# Patient Record
Sex: Female | Born: 1975 | Race: White | Hispanic: No | Smoking: Never smoker
Health system: Southern US, Community
[De-identification: ages and names within clinical notes are randomized; demographics above are authoritative.]

## PROBLEM LIST (undated history)

## (undated) DIAGNOSIS — F419 Anxiety disorder, unspecified: Secondary | ICD-10-CM

## (undated) HISTORY — DX: Anxiety disorder, unspecified: F41.9

---

## 1998-06-25 ENCOUNTER — Encounter: Admission: RE | Admit: 1998-06-25 | Discharge: 1998-06-25 | Payer: Self-pay | Admitting: *Deleted

## 2000-09-15 ENCOUNTER — Emergency Department (HOSPITAL_COMMUNITY): Admission: EM | Admit: 2000-09-15 | Discharge: 2000-09-15 | Payer: Self-pay | Admitting: Emergency Medicine

## 2004-06-27 ENCOUNTER — Other Ambulatory Visit: Admission: RE | Admit: 2004-06-27 | Discharge: 2004-06-27 | Payer: Self-pay | Admitting: *Deleted

## 2008-08-29 ENCOUNTER — Ambulatory Visit (HOSPITAL_COMMUNITY): Admission: RE | Admit: 2008-08-29 | Discharge: 2008-08-29 | Payer: Self-pay | Admitting: Obstetrics and Gynecology

## 2010-08-06 ENCOUNTER — Ambulatory Visit: Payer: Self-pay | Admitting: Physician Assistant

## 2010-08-06 ENCOUNTER — Inpatient Hospital Stay (HOSPITAL_COMMUNITY): Admission: AD | Admit: 2010-08-06 | Discharge: 2010-08-06 | Payer: Self-pay | Admitting: Obstetrics and Gynecology

## 2010-11-09 ENCOUNTER — Inpatient Hospital Stay (HOSPITAL_COMMUNITY): Admission: AD | Admit: 2010-11-09 | Payer: Self-pay | Admitting: Obstetrics and Gynecology

## 2010-11-12 ENCOUNTER — Inpatient Hospital Stay (HOSPITAL_COMMUNITY)
Admission: RE | Admit: 2010-11-12 | Discharge: 2010-11-16 | DRG: 371 | Disposition: A | Discharge status: Home / Self Care | Payer: BC Managed Care – PPO | Source: Ambulatory Visit | Attending: Obstetrics and Gynecology | Admitting: Obstetrics and Gynecology

## 2010-11-12 DIAGNOSIS — O480 Post-term pregnancy: Principal | ICD-10-CM | POA: Diagnosis present

## 2010-11-12 LAB — CBC
HCT: 39.4 % (ref 36.0–46.0)
Hemoglobin: 13.4 g/dL (ref 12.0–15.0)
MCH: 31.7 pg (ref 26.0–34.0)
MCHC: 34 g/dL (ref 30.0–36.0)
MCV: 93.1 fL (ref 78.0–100.0)
Platelets: 143 10*3/uL — ABNORMAL LOW (ref 150–400)
RBC: 4.23 MIL/uL (ref 3.87–5.11)
RDW: 13.4 % (ref 11.5–15.5)
WBC: 8.3 10*3/uL (ref 4.0–10.5)

## 2010-11-14 LAB — CBC
HCT: 32.1 % — ABNORMAL LOW (ref 36.0–46.0)
Hemoglobin: 10.8 g/dL — ABNORMAL LOW (ref 12.0–15.0)
MCH: 31.6 pg (ref 26.0–34.0)
MCV: 93.9 fL (ref 78.0–100.0)
RBC: 3.42 MIL/uL — ABNORMAL LOW (ref 3.87–5.11)
WBC: 11.5 10*3/uL — ABNORMAL HIGH (ref 4.0–10.5)

## 2010-11-27 NOTE — Op Note (Signed)
  NAMEJAIYA, [name]                 ACCOUNT NO.:  [id number]  MEDICAL RECORD NO.:  [id number]           PATIENT TYPE:  I  LOCATION:  9117                          FACILITY:  [facility]  PHYSICIAN:  Anabell Swint L. Dionis Autry, M.D.DATE OF BIRTH:  Sep 16, 1976  DATE OF PROCEDURE:  11/13/2010 DATE OF DISCHARGE:                              OPERATIVE REPORT   PREOPERATIVE DIAGNOSIS:  Intrauterine pregnancy at 40 plus weeks and arrest of dilation.  POSTOPERATIVE DIAGNOSES:  Intrauterine pregnancy at 40 plus weeks and arrest of dilation.  PROCEDURE:  Primary low transverse cesarean section.  SURGEON:  Laquilla Dault L. Alfons Sulkowski, MD  ANESTHESIA:  Epidural.  FINDINGS:  Female infant, cephalic presentation, OP position, Apgars 9 at 1 minute and 9 at 7 minutes.  PATHOLOGY:  None.  ESTIMATED BLOOD LOSS:  500 mL.  Foley is drained.  COMPLICATIONS:  None.  PROCEDURE IN DETAIL:  The patient was taken to the operating room.  Her epidural was dosed and found to be adequate.  She was prepped and draped in usual sterile fashion.  A low transverse incision was made in the skin and carried down to fascia.  Fascia was scored in midline, extended laterally.  Rectus muscles were separated in midline.  The peritoneum was entered bluntly.  The peritoneal incision was then stretched.  The bladder blade was inserted.  The lower uterine segment was identified sharply and then digitally, the bladder blade was then readjusted.  A low transverse incision was made in the uterus.  Uterus was entered using a hemostat.  The baby was OP position, was delivered without any difficulty with a female infant Apgars 9 at 1 minute and 9 at 5 minutes. The cord was clamped and cut.  The baby was handed to the awaiting neonatal team and taken to the nursery.  The placenta was manually removed, noted be normal intact with a three-vessel cord.  The uterus was exteriorized and cleared of all clots and debris.  The placenta was normal.  The  uterine incision was closed in one layer using 0 chromic in a running locked stitch.  The uterus was returned to the abdomen. Irrigation was performed.  The peritoneum was closed using 0 Vicryl.  The fascia was closed using 0 Vicryl in a running stitch.  After irrigation of subcutaneous layer, the skin was closed with staples.  All sponge, lap, and instrument counts were correct x2.  The patient went to recovery room in stable condition.     Chana Lindstrom L. Vincente Poli, M.D.     Florestine Avers  D:  11/13/2010  T:  11/14/2010  Job:  [id number]  Electronically Signed by Marcelle Overlie M.D. on 11/27/2010 07:56:16 AM

## 2010-12-17 LAB — WET PREP, GENITAL
Clue Cells Wet Prep HPF POC: NONE SEEN
Trich, Wet Prep: NONE SEEN

## 2010-12-17 LAB — URINALYSIS, ROUTINE W REFLEX MICROSCOPIC
Bilirubin Urine: NEGATIVE
Glucose, UA: NEGATIVE mg/dL
Hgb urine dipstick: NEGATIVE
Nitrite: NEGATIVE
Specific Gravity, Urine: <1.005 — ABNORMAL LOW (ref 1.005–1.030)
pH: 6 (ref 5.0–8.0)

## 2010-12-23 NOTE — Discharge Summary (Signed)
NAMEKAYLOR, [name]                 ACCOUNT NO.:  [id number]  MEDICAL RECORD NO.:  [id number]           PATIENT TYPE:  I  LOCATION:  9117                          [facility]ILITY:  [facility]  PHYSICIAN:  Dineen Kid. Rana Snare, M.D.    DATE OF BIRTH:  August 12, 1976  DATE OF ADMISSION:  11/12/2010 DATE OF DISCHARGE:  11/16/2010                              DISCHARGE SUMMARY   ADMITTING DIAGNOSES: 1. Intrauterine pregnancy at 40-1/7 weeks' estimated gestational age. 2. Postdate induction of labor.  DISCHARGE DIAGNOSES: 1. Status post low transverse cesarean section secondary to arrest of     dilatation. 2. Viable female infant.  PROCEDURE:  Primary low transverse cesarean section.  REASON FOR ADMISSION:  Please see written H and P.  HOSPITAL COURSE:  The patient is a 35 year old primigravida who was admitted to [facility] at 40-3/7 weeks estimated gestational age for postdate induction of labor.  On admission, vital signs were stable.  Fetal heart tones were reassuring.  Contractions were approximately zero to for mild the patient had been given Cytotec on the evening prior to admission.  Cervix was examined and found to be 1+ centimeters 80% effaced, vertex at -2 station.  Artificial rupture of membranes was performed which revealed clear fluid.  The patient was started on some Pitocin augmentation of labor and epidural was placed for her comfort.  Later in the afternoon, the patient was comfortable with the epidural.  Intrauterine pressure catheter revealed she was having adequate labor.  Cervix was examined and found to be high, 2 cm, 80% effaced with caput noted.  Cervix seemed to be somewhat puffy and thought to be scarring, decision was made to proceed with primary low transverse cesarean section.  The patient was then transferred to the operating room where epidural was dosed to an adequate surgical level. A low transverse incision was made with delivery of a viable  female infant weighing 7 pounds 10 ounces with Apgars of 9 at 1 minute and 9 at 5 minutes.  The patient tolerated the procedure well and was taken to the recovery room in stable condition.  On postoperative day #1, the patient was without complaint.  Vital signs were stable.  Abdomen soft. Fundus firm and nontender.  Abdominal dressings noted to have a small amount of drainage noted on the bandage.  Foley had been discontinued with adequate output.  Laboratory findings showed hemoglobin of 10.8, platelet count 127,000, blood type is known to be O+.  On postoperative day #2, the patient was without complaint.  Vital signs remained stable. Abdomen, soft.  Fundus firm and nontender.  Abdominal dressing continued to have some old drainage noted on the bandage.  She was ambulating well.  On postoperative day #3, the patient was without complaint. Vital signs were stable.  Fundus firm and nontender.  Incision was clean, dry and intact.  Discharge instructions reviewed and the patient was later discharged home.  CONDITION ON DISCHARGE:  Stable.  DIET:  Regular as tolerated.  ACTIVITY:  No heavy lifting, no driving x2 weeks, no vaginal entry.  FOLLOWUP:  Patient to follow up in the  office in 1-2 weeks for incision check.  She is to call for temperature greater than 100 degrees, persistent nausea, vomiting, heavy vaginal bleeding and/or redness or drainage from the incisional site.  DISCHARGE MEDICATIONS:  Tylox #30 one p.o. q.4-6 hours p.r.n.  Motrin 600 mg every 6 hours, prenatal vitamins one p.o. daily, Colace one p.o. daily p.r.n.     Julio Sicks, N.P.   ______________________________ Dineen Kid Rana Snare, M.D.    CC/MEDQ  D:  12/12/2010  T:  12/12/2010  Job:  [id number]  Electronically Signed by Julio Sicks N.P. on 12/13/2010 08:32:12 AM Electronically Signed by Candice Camp M.D. on 12/23/2010 07:26:07 AM

## 2011-01-28 ENCOUNTER — Ambulatory Visit (INDEPENDENT_AMBULATORY_CARE_PROVIDER_SITE_OTHER): Payer: BC Managed Care – PPO | Admitting: Gastroenterology

## 2011-01-28 ENCOUNTER — Encounter: Payer: Self-pay | Admitting: Gastroenterology

## 2011-01-28 VITALS — BP 90/58 | HR 80 | Ht 65.0 in | Wt 142.2 lb

## 2011-01-28 DIAGNOSIS — K5900 Constipation, unspecified: Secondary | ICD-10-CM

## 2011-01-28 DIAGNOSIS — K921 Melena: Secondary | ICD-10-CM

## 2011-01-28 MED ORDER — PEG-KCL-NACL-NASULF-NA ASC-C 100 G PO SOLR: 1.0000 | Freq: Once | ORAL | Status: AC

## 2011-01-28 NOTE — Patient Instructions (Addendum)
You have been scheduled for a Colonoscopy and separate instruction sheet given.  High Fiber diet handout given. Pick up your prep from your pharmacy.  Cc: Harold Hedge, MD

## 2011-01-28 NOTE — Progress Notes (Signed)
History of Present Illness: This is a 35 year old female here today with her husband. She relates a long history of mild constipation with a bowel movement generally 2-3 times per week. She is now approximately 11 weeks postpartum and has had worsening problems with constipation and recent episode of severe constipation with rectal pain and straining to pass a bowel movement for over 6 hours about one week ago and with this bowel movement she had bright red blood per rectum. She denies abdominal pain, change in stool caliber, weight loss, melena, nausea, vomiting, reflux symptoms. Her sister has inflammatory bowel disease and her mother has colon polyps. A grandparent has colon cancer.   History reviewed. No pertinent past medical history. Past Surgical History  Procedure Date  . Cesarean section     reports that she has never smoked. She has never used smokeless tobacco. She reports that she does not drink alcohol or use illicit drugs. family history includes Colon cancer in her maternal grandfather; Colon polyps in her maternal grandfather; and Ulcerative colitis in her sister. No Known Allergies  Outpatient Encounter Prescriptions as of 01/28/2011  Medication Sig Dispense Refill  . Docusate Sodium (COLACE PO) Take by mouth. As needed       . Multiple Vitamins-Calcium (ONE-A-DAY WOMENS FORMULA PO) Take by mouth 1 dose over 46 hours.        Lorita Officer Triphasic (ORTHO TRI-CYCLEN, 28, PO) Take by mouth 1 dose over 46 hours.        . peg 3350 powder (MOVIPREP) 100 G SOLR Take 1 kit (100 g total) by mouth once.  1 kit  0   Review of Systems: Pertinent positive and negative review of systems were noted in the above HPI section. All other review of systems were otherwise negative.  Physical Exam: General: Well developed , well nourished, no acute distress Head: Normocephalic and atraumatic Eyes:  sclerae anicteric, EOMI Ears: Normal auditory acuity Mouth: No deformity or  lesions Neck: Supple, no masses or thyromegaly Lungs: Clear throughout to auscultation Heart: Regular rate and rhythm; no murmurs, rubs or bruits Abdomen: Soft, non tender and non distended. No masses, hepatosplenomegaly or hernias noted. Normal Bowel sounds Rectal: Deferred to colonoscopy  Musculoskeletal: Symmetrical with no gross deformities  Skin: No lesions on visible extremities Pulses:  Normal pulses noted Extremities: No clubbing, cyanosis, edema or deformities noted Neurological: Alert oriented x 4, grossly nonfocal Cervical Nodes:  No significant cervical adenopathy Inguinal Nodes: No significant inguinal adenopathy Psychological:  Alert and cooperative. Normal mood and affect  Assessment and Recommendations:  1. Worsening constipation and one episode of hematochezia. I suspect her bleeding was related to hemorrhoids or anal/rectal irritation related to the hard stool. Rule out inflammatory bowel disease, colorectal neoplasms and other less likely abnormalities. She is advised to begin a high fiber diet and to substantially increase her daily water intake. She may use a fiber supplement and MiraLax if the dietary changes are not adequate. The risks, benefits, and alternatives to colonoscopy with possible biopsy and possible polypectomy were discussed with the patient and they consent to proceed.

## 2011-01-29 ENCOUNTER — Ambulatory Visit (AMBULATORY_SURGERY_CENTER): Payer: BC Managed Care – PPO | Admitting: Gastroenterology

## 2011-01-29 ENCOUNTER — Encounter: Payer: Self-pay | Admitting: Gastroenterology

## 2011-01-29 DIAGNOSIS — K5900 Constipation, unspecified: Secondary | ICD-10-CM

## 2011-01-29 DIAGNOSIS — K921 Melena: Secondary | ICD-10-CM

## 2011-01-29 DIAGNOSIS — R198 Other specified symptoms and signs involving the digestive system and abdomen: Secondary | ICD-10-CM

## 2011-01-29 MED ORDER — SODIUM CHLORIDE 0.9 % IV SOLN: 500.0000 mL | INTRAVENOUS | Status: AC

## 2011-01-29 NOTE — Patient Instructions (Addendum)
Discharged instructions given with verbal understanding. Normal examination. Resume previous medications. High fiber diet given.

## 2011-01-30 ENCOUNTER — Telehealth: Payer: Self-pay

## 2011-01-30 NOTE — Telephone Encounter (Signed)
Left message

## 2011-07-08 LAB — ABO/RH: ABO/RH(D): O POS

## 2013-12-03 ENCOUNTER — Ambulatory Visit: Payer: Self-pay

## 2013-12-03 LAB — RAPID INFLUENZA A&B ANTIGENS

## 2014-03-07 ENCOUNTER — Ambulatory Visit: Payer: BC Managed Care – PPO | Admitting: Internal Medicine

## 2014-03-29 ENCOUNTER — Ambulatory Visit: Payer: BC Managed Care – PPO | Admitting: Internal Medicine

## 2015-03-24 ENCOUNTER — Ambulatory Visit
Admission: EM | Admit: 2015-03-24 | Discharge: 2015-03-24 | Disposition: A | Discharge status: Home / Self Care | Payer: BC Managed Care – PPO | Attending: Family Medicine | Admitting: Family Medicine

## 2015-03-24 ENCOUNTER — Encounter: Payer: Self-pay | Admitting: Gynecology

## 2015-03-24 DIAGNOSIS — Z79899 Other long term (current) drug therapy: Secondary | ICD-10-CM | POA: Diagnosis not present

## 2015-03-24 DIAGNOSIS — J029 Acute pharyngitis, unspecified: Secondary | ICD-10-CM | POA: Diagnosis present

## 2015-03-24 DIAGNOSIS — J020 Streptococcal pharyngitis: Secondary | ICD-10-CM | POA: Diagnosis not present

## 2015-03-24 LAB — RAPID STREP SCREEN (MED CTR MEBANE ONLY): Streptococcus, Group A Screen (Direct): POSITIVE — AB

## 2015-03-24 MED ORDER — IBUPROFEN 800 MG PO TABS
800.0000 mg | ORAL_TABLET | Freq: Once | ORAL | Status: AC
  Administered 2015-03-24: 800 mg via ORAL

## 2015-03-24 MED ORDER — PENICILLIN G BENZATHINE 1200000 UNIT/2ML IM SUSP
1.2000 10*6.[IU] | Freq: Once | INTRAMUSCULAR | Status: AC
  Administered 2015-03-24: 1.2 10*6.[IU] via INTRAMUSCULAR

## 2015-03-24 NOTE — Discharge Instructions (Signed)
Good handwashing and mask precautions-during care of  your husband... Ibuprofen/Tylenol alternating - fluids-rest and as much self care as possible! Hope you are feeling better very soon!  Strep Throat Strep throat is an infection of the throat caused by a bacteria named Streptococcus pyogenes. Your health care provider may call the infection streptococcal "tonsillitis" or "pharyngitis" depending on whether there are signs of inflammation in the tonsils or back of the throat. Strep throat is most common in children aged 5-15 years during the cold months of the year, but it can occur in people of any age during any season. This infection is spread from person to person (contagious) through coughing, sneezing, or other close contact. SIGNS AND SYMPTOMS   Fever or chills.  Painful, swollen, red tonsils or throat.  Pain or difficulty when swallowing.  White or yellow spots on the tonsils or throat.  Swollen, tender lymph nodes or "glands" of the neck or under the jaw.  Red rash all over the body (rare). DIAGNOSIS  Many different infections can cause the same symptoms. A test must be done to confirm the diagnosis so the right treatment can be given. A "rapid strep test" can help your health care provider make the diagnosis in a few minutes. If this test is not available, a light swab of the infected area can be used for a throat culture test. If a throat culture test is done, results are usually available in a day or two. TREATMENT  Strep throat is treated with antibiotic medicine. HOME CARE INSTRUCTIONS   Gargle with 1 tsp of salt in 1 cup of warm water, 3-4 times per day or as needed for comfort.  Family members who also have a sore throat or fever should be tested for strep throat and treated with antibiotics if they have the strep infection.  Make sure everyone in your household washes their hands well.  Do not share food, drinking cups, or personal items that could cause the infection  to spread to others.  You may need to eat a soft food diet until your sore throat gets better.  Drink enough water and fluids to keep your urine clear or pale yellow. This will help prevent dehydration.  Get plenty of rest.  Stay home from school, day care, or work until you have been on antibiotics for 24 hours.  Take medicines only as directed by your health care provider.  Take your antibiotic medicine as directed by your health care provider. Finish it even if you start to feel better. SEEK MEDICAL CARE IF:   The glands in your neck continue to enlarge.  You develop a rash, cough, or earache.  You cough up green, yellow-brown, or bloody sputum.  You have pain or discomfort not controlled by medicines.  Your problems seem to be getting worse rather than better.  You have a fever. SEEK IMMEDIATE MEDICAL CARE IF:   You develop any new symptoms such as vomiting, severe headache, stiff or painful neck, chest pain, shortness of breath, or trouble swallowing.  You develop severe throat pain, drooling, or changes in your voice.  You develop swelling of the neck, or the skin on the neck becomes red and tender.  You develop signs of dehydration, such as fatigue, dry mouth, and decreased urination.  You become increasingly sleepy, or you cannot wake up completely. MAKE SURE YOU:  Understand these instructions.  Will watch your condition.  Will get help right away if you are not doing well or  get worse. Document Released: 09/19/2000 Document Revised: 02/06/2014 Document Reviewed: 11/21/2010 [facility] Patient Information 2015 [place], [place]. This information is not intended to replace advice given to you by your health care provider. Make sure you discuss any questions you have with your health care provider.

## 2015-03-24 NOTE — ED Provider Notes (Signed)
CSN: [id number]     Arrival date & time 03/24/15  1020 History   First MD Initiated Contact with Patient 03/24/15 1115     Chief Complaint  Patient presents with  . 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 Mild temp elevation -taking tylenol- no Rx today-general malaise  History reviewed. No pertinent past medical history. Past Surgical History  Procedure Laterality Date  . Cesarean section     Family History  Problem Relation Age of Onset  . Colon cancer Maternal Grandfather   . Colon polyps Maternal Grandfather     MOTHER  . Ulcerative colitis Sister   . Lymphoma Father    History  Substance Use Topics  . Smoking status: Never Smoker   . Smokeless tobacco: Never Used  . Alcohol Use: No   OB History    No data available     Review of Systems Review of 10 systems negative for acute change except as referenced in HPI Allergies  Review of patient's allergies indicates no known allergies.  Home Medications   Prior to Admission medications   Medication Sig Start Date End Date Taking? Authorizing Provider  Docusate Sodium (COLACE PO) Take by mouth. As needed     Historical Provider, MD  Multiple Vitamins-Calcium (ONE-A-DAY WOMENS FORMULA PO) Take by mouth 1 dose over 46 hours.      Historical Provider, MD  Norgestim-Eth Estrad Triphasic (ORTHO TRI-CYCLEN, 28, PO) Take by mouth 1 dose over 46 hours.      Historical Provider, MD   BP 114/68 mmHg  Pulse 94  Temp(Src) 99.1 F (37.3 C) (Tympanic)  Ht 5\' 6"  (1.676 m)  Wt 158 lb (71.668 kg)  BMI 25.51 kg/m2  SpO2 100%  LMP 02/13/2015 Physical Exam Constitutional -alert and oriented,tired appearing Head-atraumatic Eyes- conjunctiva normal, EOMI ,conjugate gaze Nose-  congestion  Mouth/throat- mucous membranes moist ,oropharynx beefy red;  tonsil with exudate; strep- positive Neck- supple , bilateral ant  glandular enlargement, mild tender,rubbery CV- regular rate, grossly normal heart sounds,  Resp-no distress, normal respiratory effort,clear to auscultation bilaterally GI- soft,non-tender,no distention GU-  not examined MSK- no lower extremity tenderness nor edema, ambulatory Neuro- normal speech and language, Skin-warm,dry ,intact; no rash noted Psych-mood and affect grossly normal; speech and behavior grossly normal ED Course  Procedures (including critical care time) Labs Review Labs Reviewed  RAPID STREP SCREEN (NOT AT [facility]) - Abnormal; Notable for the following:    Streptococcus, Group A Screen (Direct) POSITIVE (*)    All other components within normal limits   Results for orders placed or performed during the hospital encounter of 03/24/15  Rapid strep screen  Result Value Ref Range   Streptococcus, Group A Screen (Direct) POSITIVE (A) NEGATIVE     Medications  penicillin g benzathine (BICILLIN LA) 1200000 UNIT/2ML injection 1.2 Million Units (1.2 Million Units Intramuscular Given 03/24/15 1135)  ibuprofen (ADVIL,MOTRIN) tablet 800 mg (800 mg Oral Given 03/24/15 1138)   Patient tolerated Rx without difficulty. Feels better after Ibuprofen took away discomfort  Imaging Review No results found.   She opts for IM Rx as husband has cancer and she wants germs under control in the houshold ASAP.  MDM   1. Strep pharyngitis     Plan: 1. Test results and diagnosis reviewed with patient 2. Rx as per orders; risks, benefits, potential side effects reviewed with patient 3. Recommend  supportive treatment with ibuprofen,tylenol, soft foods, advance as tolerted 4. F/u prn if symptoms worsen or don't improve   Rae Halsted, PA-C 03/26/15 0018

## 2015-03-24 NOTE — ED Notes (Signed)
Pt. C/o sore throat and body aches. Per pt. Painful to swallow x yesterday.Marland Kitchen

## 2015-03-26 ENCOUNTER — Encounter: Payer: Self-pay | Admitting: Physician Assistant

## 2015-09-04 ENCOUNTER — Other Ambulatory Visit: Payer: Self-pay | Admitting: Otolaryngology

## 2015-09-04 DIAGNOSIS — E041 Nontoxic single thyroid nodule: Secondary | ICD-10-CM

## 2015-09-10 ENCOUNTER — Ambulatory Visit
Admission: RE | Admit: 2015-09-10 | Discharge: 2015-09-10 | Disposition: A | Discharge status: Home / Self Care | Payer: BC Managed Care – PPO | Source: Ambulatory Visit | Attending: Otolaryngology | Admitting: Otolaryngology

## 2015-09-10 DIAGNOSIS — E041 Nontoxic single thyroid nodule: Secondary | ICD-10-CM | POA: Diagnosis not present

## 2015-09-11 ENCOUNTER — Other Ambulatory Visit: Payer: Self-pay | Admitting: Otolaryngology

## 2015-09-11 DIAGNOSIS — E041 Nontoxic single thyroid nodule: Secondary | ICD-10-CM

## 2015-12-05 ENCOUNTER — Ambulatory Visit: Payer: BC Managed Care – PPO | Admitting: Nurse Practitioner

## 2016-06-02 ENCOUNTER — Other Ambulatory Visit: Payer: Self-pay | Admitting: Obstetrics and Gynecology

## 2016-06-02 DIAGNOSIS — R928 Other abnormal and inconclusive findings on diagnostic imaging of breast: Secondary | ICD-10-CM

## 2016-06-05 ENCOUNTER — Ambulatory Visit
Admission: RE | Admit: 2016-06-05 | Discharge: 2016-06-05 | Disposition: A | Discharge status: Home / Self Care | Payer: BC Managed Care – PPO | Source: Ambulatory Visit | Attending: Obstetrics and Gynecology | Admitting: Obstetrics and Gynecology

## 2016-06-05 DIAGNOSIS — R928 Other abnormal and inconclusive findings on diagnostic imaging of breast: Secondary | ICD-10-CM

## 2016-09-09 ENCOUNTER — Ambulatory Visit: Payer: BC Managed Care – PPO

## 2016-12-29 ENCOUNTER — Other Ambulatory Visit: Payer: Self-pay | Admitting: Obstetrics and Gynecology

## 2016-12-29 DIAGNOSIS — N6310 Unspecified lump in the right breast, unspecified quadrant: Secondary | ICD-10-CM

## 2017-01-05 ENCOUNTER — Ambulatory Visit
Admission: RE | Admit: 2017-01-05 | Discharge: 2017-01-05 | Disposition: A | Discharge status: Home / Self Care | Payer: BC Managed Care – PPO | Source: Ambulatory Visit | Attending: Obstetrics and Gynecology | Admitting: Obstetrics and Gynecology

## 2017-01-05 DIAGNOSIS — N6310 Unspecified lump in the right breast, unspecified quadrant: Secondary | ICD-10-CM

## 2017-09-09 ENCOUNTER — Other Ambulatory Visit: Payer: Self-pay | Admitting: Obstetrics and Gynecology

## 2017-09-09 DIAGNOSIS — N6310 Unspecified lump in the right breast, unspecified quadrant: Secondary | ICD-10-CM

## 2017-09-17 ENCOUNTER — Ambulatory Visit
Admission: RE | Admit: 2017-09-17 | Discharge: 2017-09-17 | Disposition: A | Discharge status: Home / Self Care | Payer: BC Managed Care – PPO | Source: Ambulatory Visit | Attending: Obstetrics and Gynecology | Admitting: Obstetrics and Gynecology

## 2017-09-17 ENCOUNTER — Other Ambulatory Visit: Payer: BC Managed Care – PPO

## 2017-09-17 ENCOUNTER — Other Ambulatory Visit: Payer: Self-pay | Admitting: Obstetrics and Gynecology

## 2017-09-17 DIAGNOSIS — N6001 Solitary cyst of right breast: Secondary | ICD-10-CM

## 2017-09-17 DIAGNOSIS — N6310 Unspecified lump in the right breast, unspecified quadrant: Secondary | ICD-10-CM

## 2017-09-17 DIAGNOSIS — N6002 Solitary cyst of left breast: Principal | ICD-10-CM

## 2017-09-17 DIAGNOSIS — N6320 Unspecified lump in the left breast, unspecified quadrant: Secondary | ICD-10-CM

## 2018-03-19 ENCOUNTER — Ambulatory Visit
Admission: RE | Admit: 2018-03-19 | Discharge: 2018-03-19 | Disposition: A | Discharge status: Home / Self Care | Payer: BC Managed Care – PPO | Source: Ambulatory Visit | Attending: Obstetrics and Gynecology | Admitting: Obstetrics and Gynecology

## 2018-03-19 ENCOUNTER — Other Ambulatory Visit: Payer: BC Managed Care – PPO

## 2018-03-19 ENCOUNTER — Other Ambulatory Visit: Payer: Self-pay | Admitting: Obstetrics and Gynecology

## 2018-03-19 DIAGNOSIS — N6002 Solitary cyst of left breast: Principal | ICD-10-CM

## 2018-03-19 DIAGNOSIS — N6001 Solitary cyst of right breast: Secondary | ICD-10-CM

## 2018-09-20 ENCOUNTER — Ambulatory Visit
Admission: RE | Admit: 2018-09-20 | Discharge: 2018-09-20 | Disposition: A | Discharge status: Home / Self Care | Payer: BC Managed Care – PPO | Source: Ambulatory Visit | Attending: Obstetrics and Gynecology | Admitting: Obstetrics and Gynecology

## 2018-09-20 DIAGNOSIS — N6002 Solitary cyst of left breast: Principal | ICD-10-CM

## 2018-09-20 DIAGNOSIS — N6001 Solitary cyst of right breast: Secondary | ICD-10-CM

## 2019-12-26 ENCOUNTER — Other Ambulatory Visit: Payer: Self-pay | Admitting: Obstetrics and Gynecology

## 2019-12-26 DIAGNOSIS — N6320 Unspecified lump in the left breast, unspecified quadrant: Secondary | ICD-10-CM

## 2020-03-12 ENCOUNTER — Ambulatory Visit
Admission: RE | Admit: 2020-03-12 | Discharge: 2020-03-12 | Disposition: A | Discharge status: Home / Self Care | Payer: BC Managed Care – PPO | Source: Ambulatory Visit | Attending: Obstetrics and Gynecology | Admitting: Obstetrics and Gynecology

## 2020-03-12 ENCOUNTER — Other Ambulatory Visit: Payer: Self-pay

## 2020-03-12 DIAGNOSIS — N6320 Unspecified lump in the left breast, unspecified quadrant: Secondary | ICD-10-CM

## 2020-06-06 ENCOUNTER — Other Ambulatory Visit: Payer: Self-pay | Admitting: Sleep Medicine

## 2020-06-06 ENCOUNTER — Other Ambulatory Visit: Payer: BC Managed Care – PPO

## 2020-06-06 DIAGNOSIS — I471 Supraventricular tachycardia, unspecified: Secondary | ICD-10-CM

## 2020-06-07 LAB — NOVEL CORONAVIRUS, NAA: SARS-CoV-2, NAA: DETECTED — AB

## 2021-04-18 ENCOUNTER — Other Ambulatory Visit: Payer: Self-pay | Admitting: Obstetrics and Gynecology

## 2021-04-18 DIAGNOSIS — Z1231 Encounter for screening mammogram for malignant neoplasm of breast: Secondary | ICD-10-CM

## 2021-07-10 ENCOUNTER — Ambulatory Visit
Admission: RE | Admit: 2021-07-10 | Discharge: 2021-07-10 | Disposition: A | Discharge status: Home / Self Care | Payer: BC Managed Care – PPO | Source: Ambulatory Visit | Attending: Obstetrics and Gynecology | Admitting: Obstetrics and Gynecology

## 2021-07-10 ENCOUNTER — Other Ambulatory Visit: Payer: Self-pay

## 2021-07-10 DIAGNOSIS — Z1231 Encounter for screening mammogram for malignant neoplasm of breast: Secondary | ICD-10-CM

## 2021-07-15 ENCOUNTER — Other Ambulatory Visit: Payer: Self-pay | Admitting: Obstetrics and Gynecology

## 2021-07-15 DIAGNOSIS — R928 Other abnormal and inconclusive findings on diagnostic imaging of breast: Secondary | ICD-10-CM

## 2021-07-23 ENCOUNTER — Ambulatory Visit
Admission: RE | Admit: 2021-07-23 | Discharge: 2021-07-23 | Disposition: A | Discharge status: Home / Self Care | Payer: BC Managed Care – PPO | Source: Ambulatory Visit | Attending: Obstetrics and Gynecology | Admitting: Obstetrics and Gynecology

## 2021-07-23 ENCOUNTER — Other Ambulatory Visit: Payer: Self-pay

## 2021-07-23 DIAGNOSIS — R928 Other abnormal and inconclusive findings on diagnostic imaging of breast: Secondary | ICD-10-CM

## 2022-04-17 IMAGING — US US BREAST*L* LIMITED INC AXILLA
1 series · 11 of 11 positions shown · non-contrast
Comparison: Previous exam(s).

CLINICAL DATA: Patient recalled from screening for mass in the
right breast and asymmetries in the left breast.



[Series 1: us breast*left* limited inc axilla · 0.06mm/px · 11 of 11 slices shown]
[im 1/11]
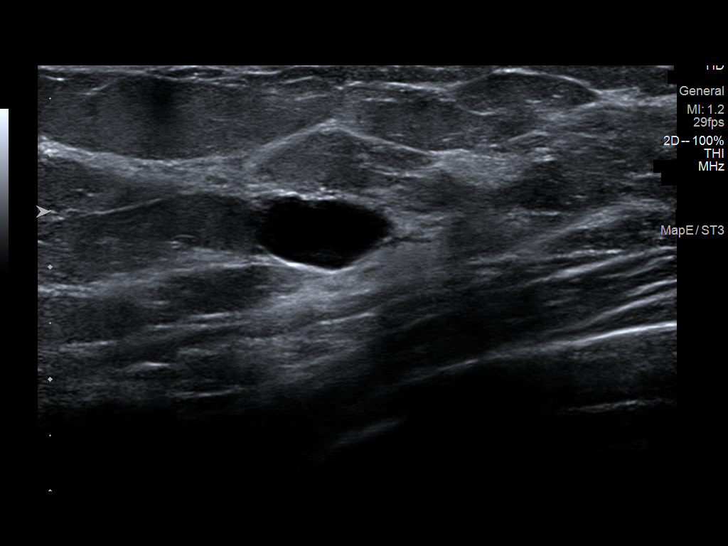
[im 2/11]
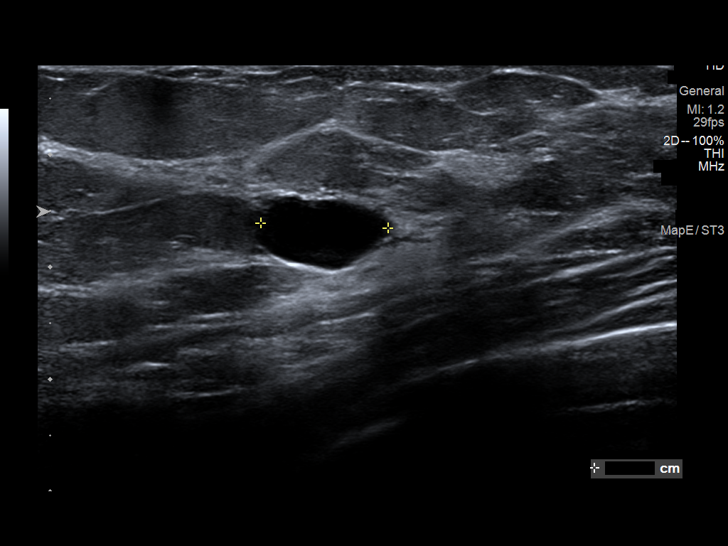
[im 3/11]
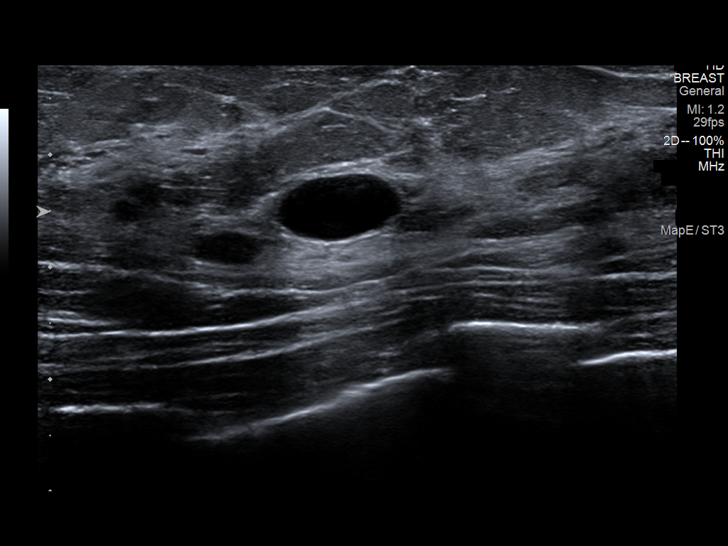
[im 4/11]
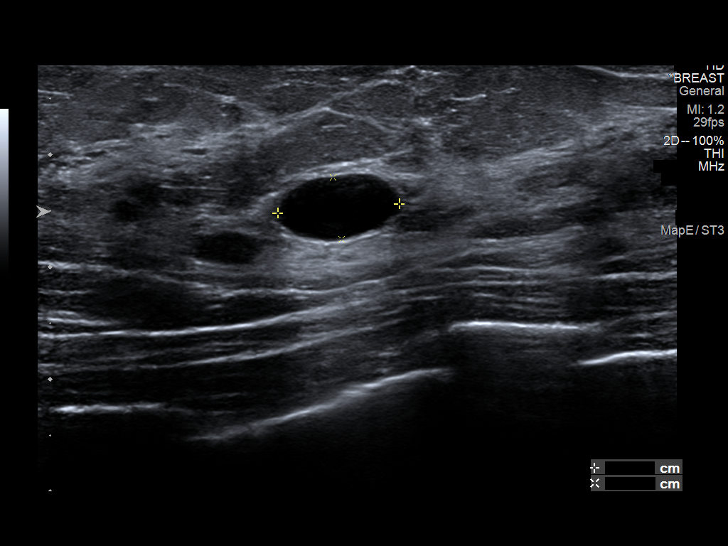
[im 5/11]
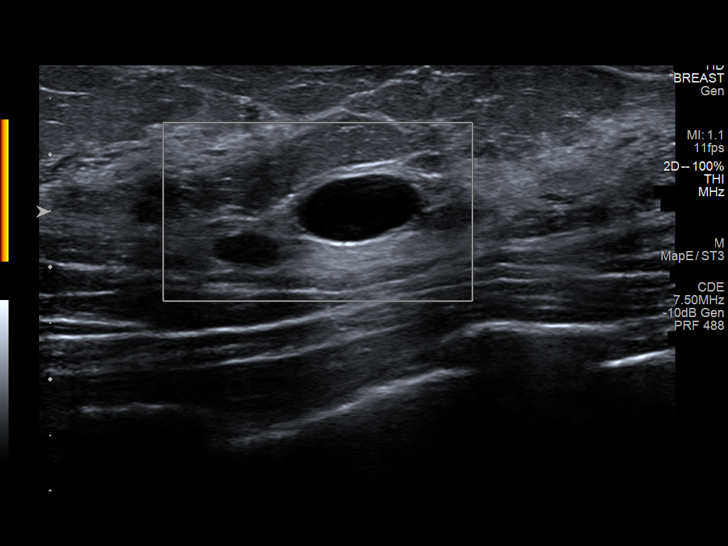
[im 6/11]
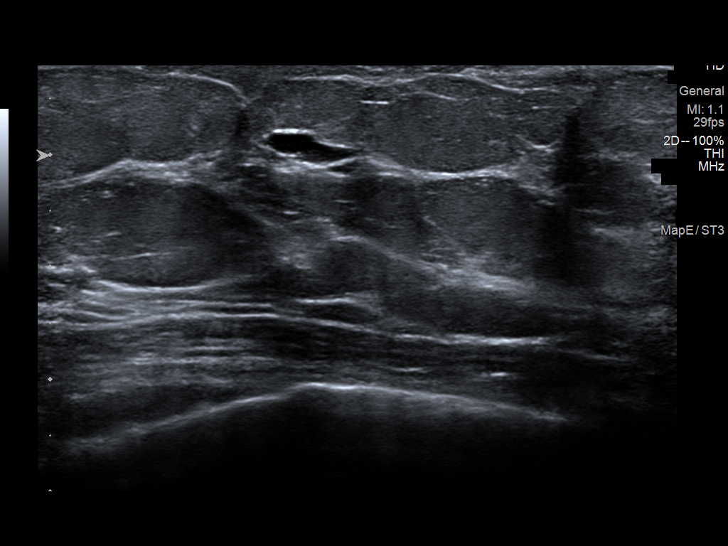
[im 7/11]
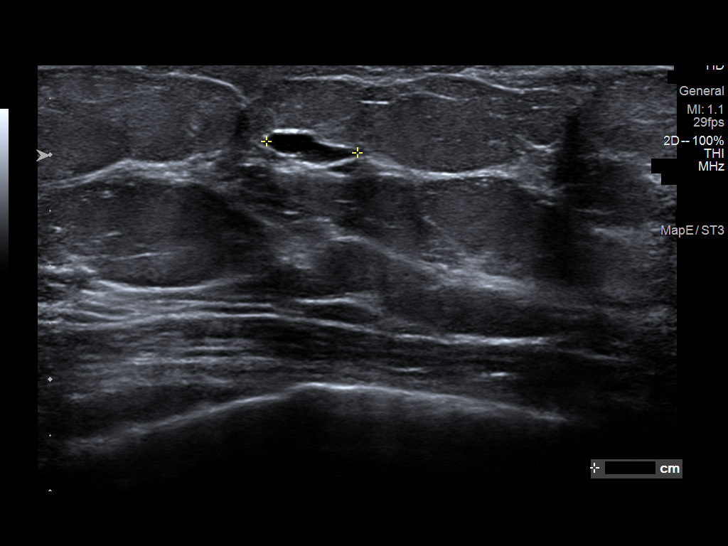
[im 8/11]
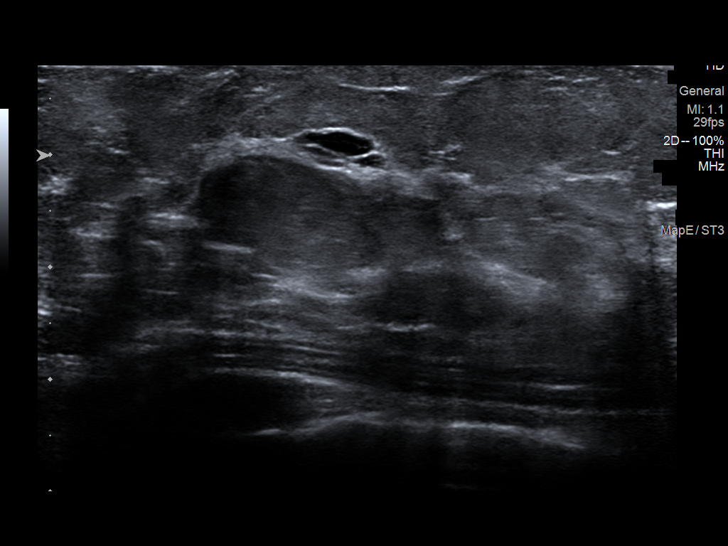
[im 9/11]
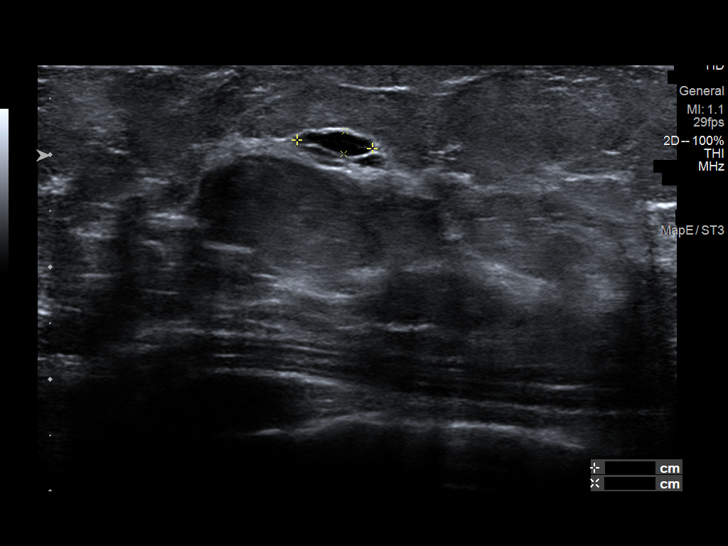
[im 10/11]
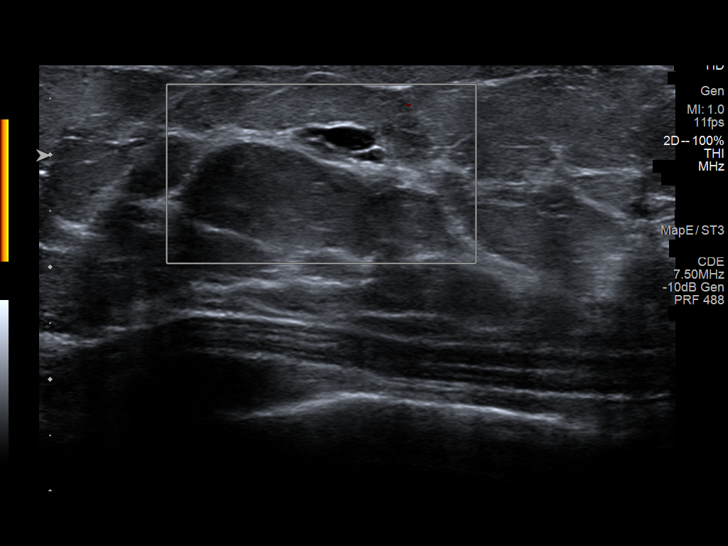
[im 11/11]
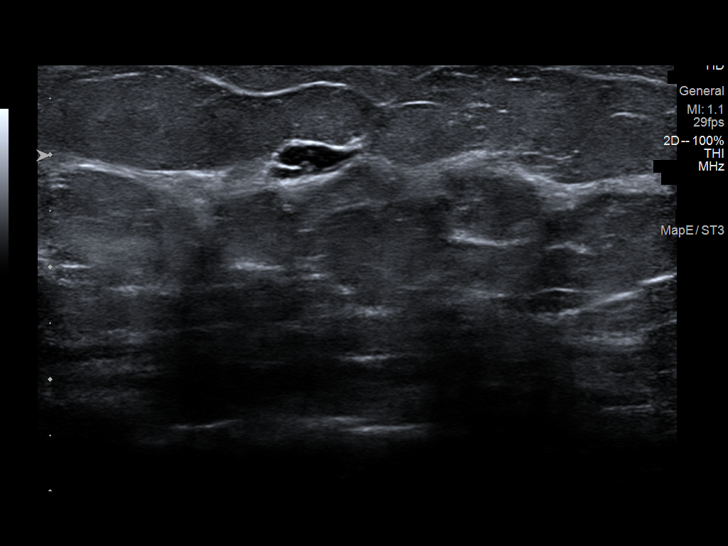

[11 of 11 positions shown; findings below may reference images not displayed]

ACR Breast Density Category c: The breast tissue is heterogeneously
dense, which may obscure small masses.
FINDINGS: Within the outer right breast middle depth there is a persistent
oval circumscribed mass.

Within the medial left breast there is a persistent small oval mass.

Within the upper-outer left breast there is a persistent oval
circumscribed mass.

Targeted ultrasound is performed, showing a 2.0 x 0.8 x 1.4 cm cyst
right breast 9 o'clock position 4 cm from the nipple.

There is a 1.1 x 1.0 x 0.6 cm cyst left breast 2:30 o'clock 4 cm
from the nipple.

There is a 0.8 x 0.7 x 0.2 cm cluster of cysts left breast 9 o'clock
position 4 cm from the nipple.
IMPRESSION: No mammographic evidence for malignancy.  Bilateral cysts.

RECOMMENDATION:
Screening mammogram in one year.(Code:54-C-5NA)

I have discussed the findings and recommendations with the patient.
If applicable, a reminder letter will be sent to the patient
regarding the next appointment.

BI-RADS CATEGORY  2: Benign.

## 2022-04-17 IMAGING — MG DIGITAL DIAGNOSTIC BILAT W/ TOMO W/ CAD
6 of 10 series · 6 of 30 positions shown · non-contrast
Comparison: Previous exam(s).

CLINICAL DATA: Patient recalled from screening for mass in the
right breast and asymmetries in the left breast.



[L ML synth-2D]
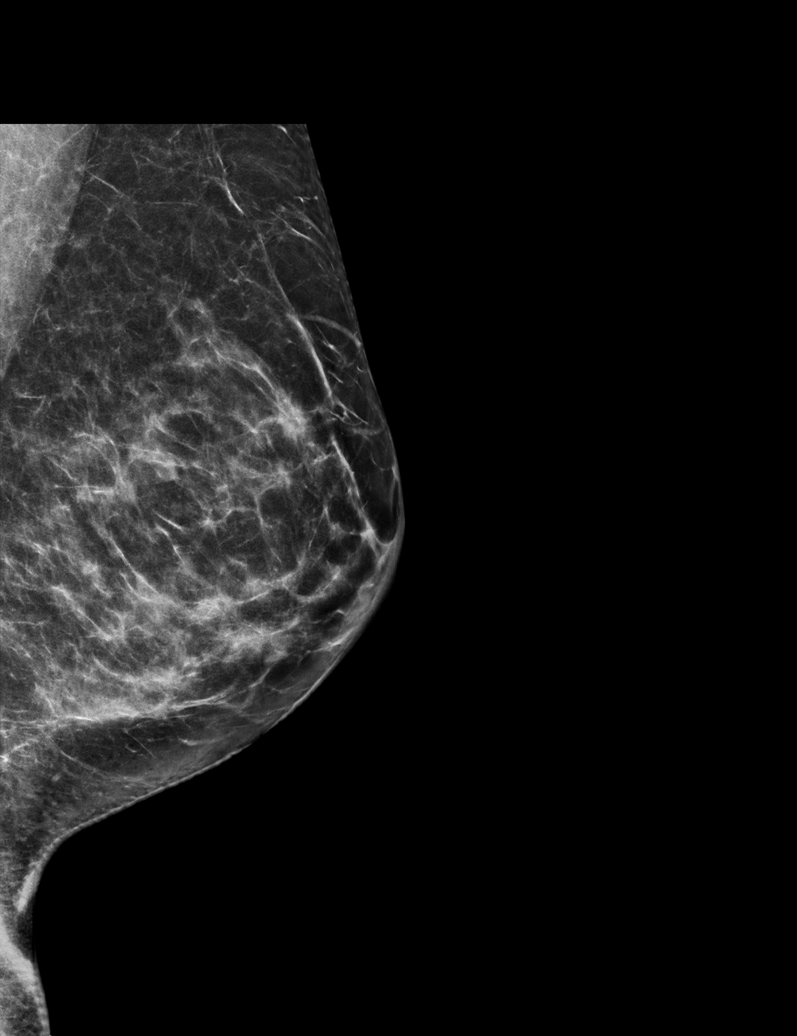

[R MLO synth-2D]
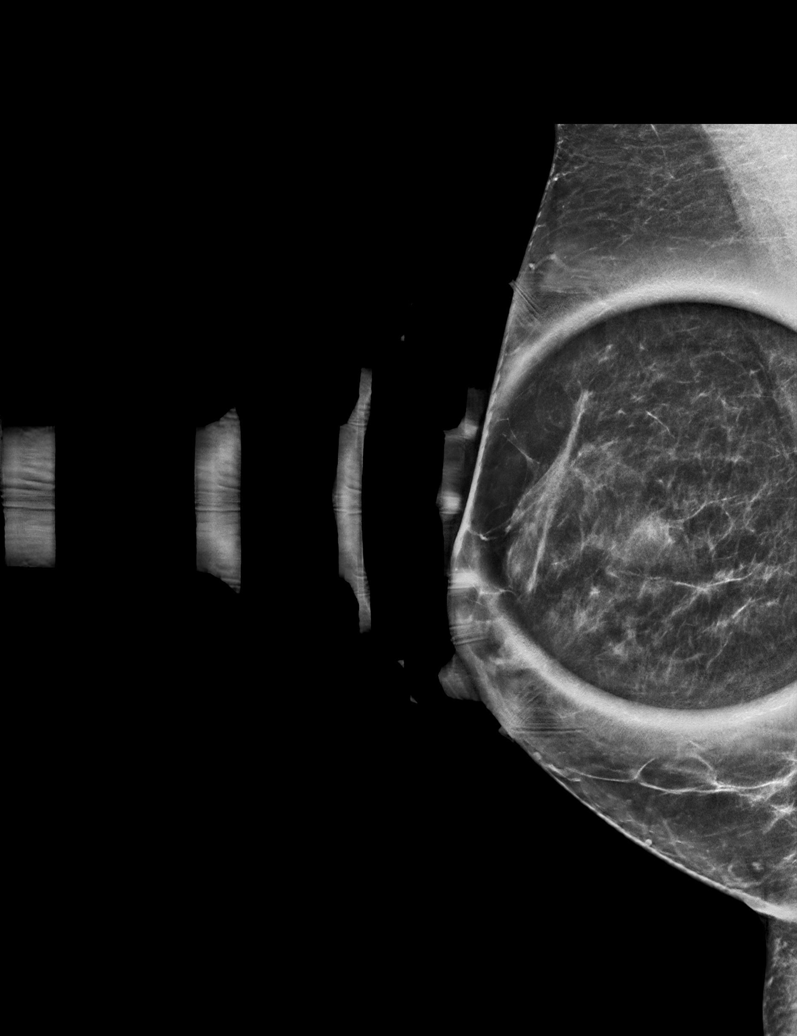

[L MLO synth-2D]
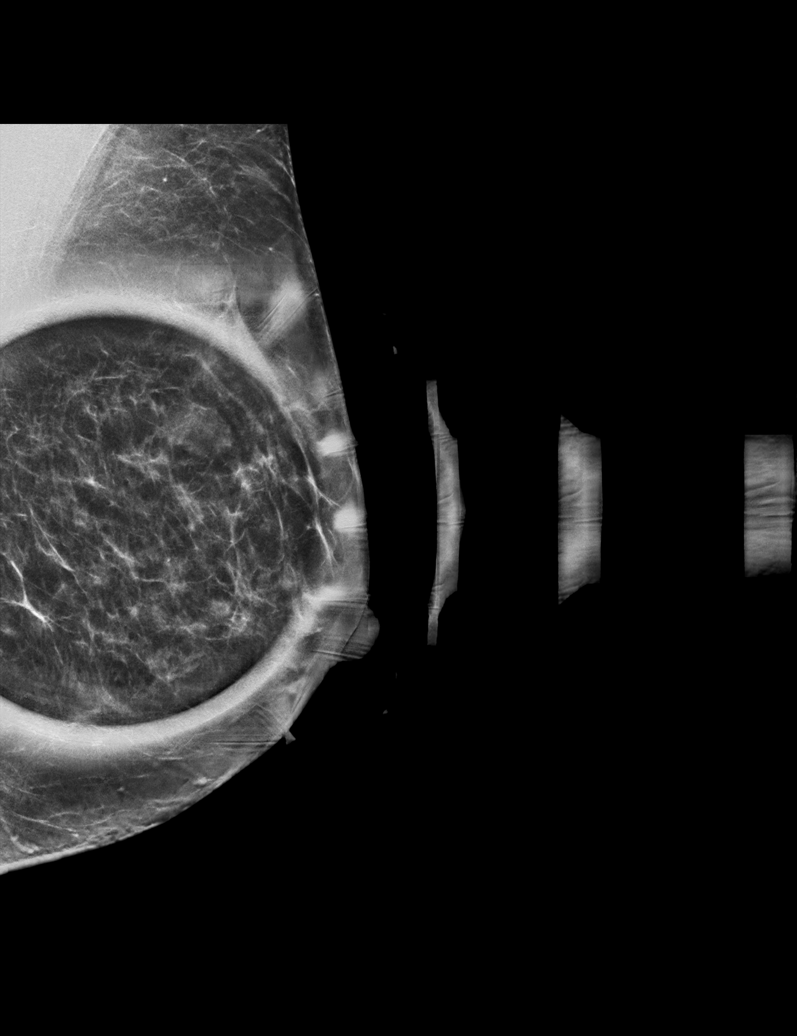

[L CC synth-2D]
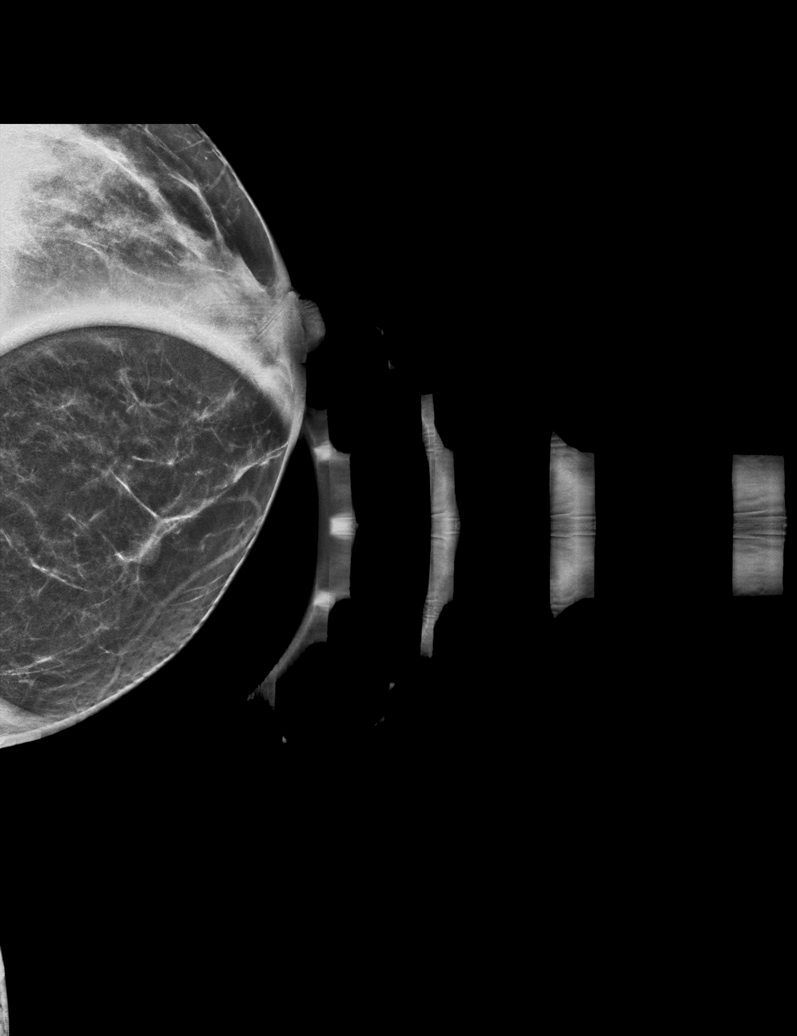

[R CC synth-2D]
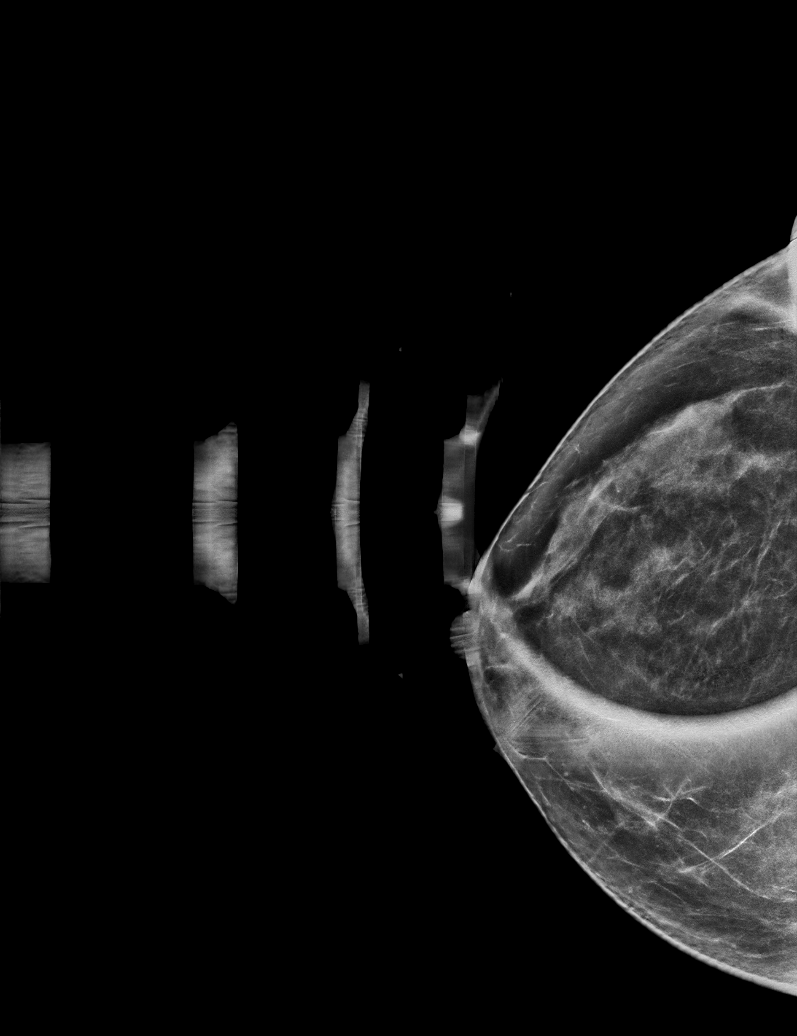

[R CC tomo · tomo slice 33/64.0]
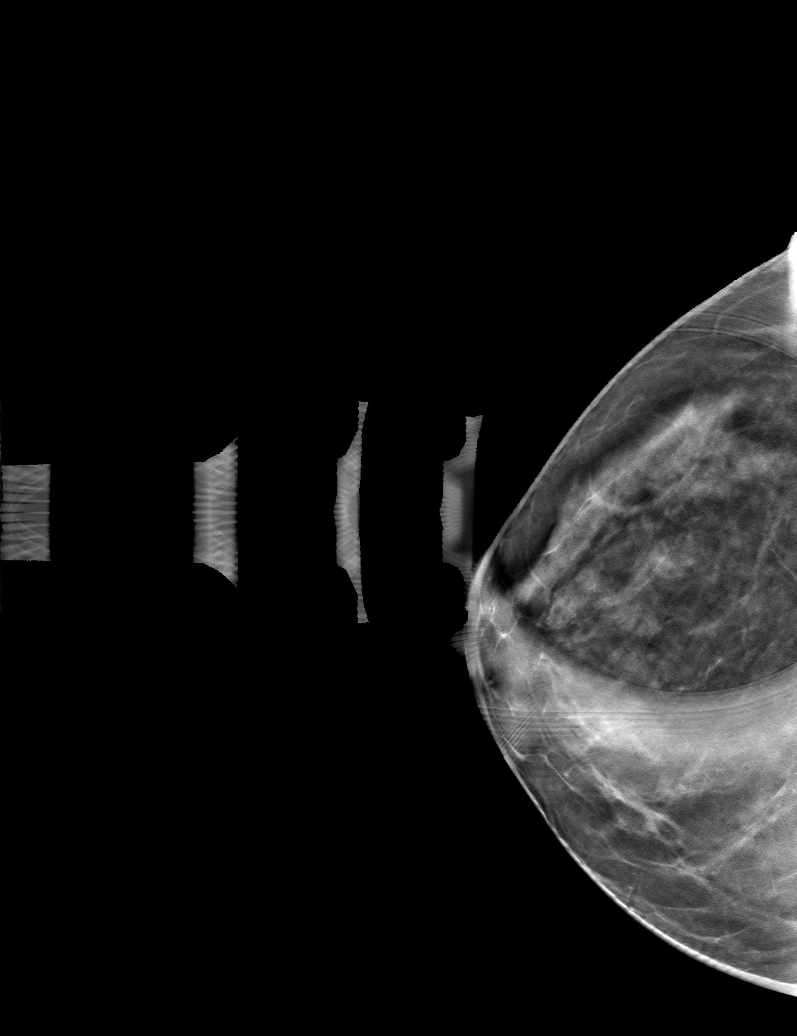

[6 of 30 positions shown; findings below may reference images not displayed]

ACR Breast Density Category c: The breast tissue is heterogeneously
dense, which may obscure small masses.
FINDINGS: Within the outer right breast middle depth there is a persistent
oval circumscribed mass.

Within the medial left breast there is a persistent small oval mass.

Within the upper-outer left breast there is a persistent oval
circumscribed mass.

Targeted ultrasound is performed, showing a 2.0 x 0.8 x 1.4 cm cyst
right breast 9 o'clock position 4 cm from the nipple.

There is a 1.1 x 1.0 x 0.6 cm cyst left breast 2:30 o'clock 4 cm
from the nipple.

There is a 0.8 x 0.7 x 0.2 cm cluster of cysts left breast 9 o'clock
position 4 cm from the nipple.
IMPRESSION: No mammographic evidence for malignancy.  Bilateral cysts.

RECOMMENDATION:
Screening mammogram in one year.(Code:54-C-5NA)

I have discussed the findings and recommendations with the patient.
If applicable, a reminder letter will be sent to the patient
regarding the next appointment.

BI-RADS CATEGORY  2: Benign.

## 2023-03-02 ENCOUNTER — Other Ambulatory Visit: Payer: Self-pay

## 2023-03-02 ENCOUNTER — Encounter: Payer: Self-pay | Admitting: Emergency Medicine

## 2023-03-02 ENCOUNTER — Ambulatory Visit
Admission: EM | Admit: 2023-03-02 | Discharge: 2023-03-02 | Disposition: A | Discharge status: Home / Self Care | Payer: BC Managed Care – PPO

## 2023-03-02 DIAGNOSIS — J069 Acute upper respiratory infection, unspecified: Secondary | ICD-10-CM | POA: Diagnosis not present

## 2023-03-02 MED ORDER — AMOXICILLIN-POT CLAVULANATE 875-125 MG PO TABS: 1.0000 | ORAL_TABLET | Freq: Two times a day (BID) | ORAL | 0 refills | Status: DC

## 2023-03-02 NOTE — ED Provider Notes (Signed)
[facility]    CSN: [id number] Arrival date & time: 03/02/23  1104      History   Chief Complaint Chief Complaint  Patient presents with   URI    HPI [name] is a 47 y.o. female.   Patient presents with 7-day history of nasal congestion.  Reports very minimal facial pain bilaterally.  Denies cough.  Reports that she had a low-grade temp a few days ago of 99.  Denies any known sick contacts.  Patient not reporting any chest pain, shortness of breath, gastrointestinal symptoms.  She has taken Tylenol cold at start of symptoms with minimal improvement.  Reports that she had similar symptoms approximately 1 month ago which resolved with over-the-counter medications.  These are new symptoms.   URI   History reviewed. No pertinent past medical history.  There are no problems to display for this patient.   Past Surgical History:  Procedure Laterality Date   CESAREAN SECTION      OB History   No obstetric history on file.      Home Medications    Prior to Admission medications   Medication Sig Start Date End Date Taking? Authorizing Provider  amoxicillin-clavulanate (AUGMENTIN) 875-125 MG tablet Take 1 tablet by mouth every 12 (twelve) hours. 03/02/23  Yes Tammye Kahler, Acie Fredrickson, FNP  Docusate Sodium (COLACE PO) Take by mouth. As needed     [provider]  escitalopram (LEXAPRO) 5 MG tablet Take 5 mg by mouth daily.    [provider]  Multiple Vitamins-Calcium (ONE-A-DAY WOMENS FORMULA PO) Take by mouth 1 dose over 46 hours.      [provider]  Norgestim-Eth Estrad Triphasic (ORTHO TRI-CYCLEN, 28, PO) Take by mouth 1 dose over 46 hours.   Patient not taking: Reported on 03/02/2023    [provider]    Family History Family History  Problem Relation Age of Onset   Colon cancer Maternal Grandfather    Colon polyps Maternal Grandfather        MOTHER   Ulcerative colitis Sister    Lymphoma Father    Breast cancer  Cousin     Social History Social History   Tobacco Use   Smoking status: Never   Smokeless tobacco: Never  Vaping Use   Vaping Use: Never used  Substance Use Topics   Alcohol use: No   Drug use: No     Allergies   Patient has no known allergies.   Review of Systems Review of Systems Per HPI  Physical Exam Triage Vital Signs ED Triage Vitals  Enc Vitals Group     BP 03/02/23 1305 117/82     Pulse Rate 03/02/23 1305 90     Resp 03/02/23 1305 18     Temp 03/02/23 1305 97.9 F (36.6 C)     Temp Source 03/02/23 1305 Oral     SpO2 03/02/23 1305 96 %     Weight --      Height --      Head Circumference --      Peak Flow --      Pain Score 03/02/23 1301 2     Pain Loc --      Pain Edu? --      Excl. in GC? --    No data found.  Updated Vital Signs BP 117/82 (BP Location: Left Arm)   Pulse 90   Temp 97.9 F (36.6 C) (Oral)   Resp 18   LMP 02/23/2023  SpO2 96%   Visual Acuity Right Eye Distance:   Left Eye Distance:   Bilateral Distance:    Right Eye Near:   Left Eye Near:    Bilateral Near:     Physical Exam Constitutional:      General: She is not in acute distress.    Appearance: Normal appearance. She is not toxic-appearing or diaphoretic.  HENT:     Head: Normocephalic and atraumatic.     Right Ear: Tympanic membrane and ear canal normal.     Left Ear: Tympanic membrane and ear canal normal.     Nose: Congestion present.     Mouth/Throat:     Mouth: Mucous membranes are moist.     Pharynx: No posterior oropharyngeal erythema.  Eyes:     Extraocular Movements: Extraocular movements intact.     Conjunctiva/sclera: Conjunctivae normal.     Pupils: Pupils are equal, round, and reactive to light.  Cardiovascular:     Rate and Rhythm: Normal rate and regular rhythm.     Pulses: Normal pulses.     Heart sounds: Normal heart sounds.  Pulmonary:     Effort: Pulmonary effort is normal. No respiratory distress.     Breath sounds: Normal breath  sounds. No stridor. No wheezing, rhonchi or rales.  Abdominal:     General: Abdomen is flat. Bowel sounds are normal.     Palpations: Abdomen is soft.  Musculoskeletal:        General: Normal range of motion.     Cervical back: Normal range of motion.  Skin:    General: Skin is warm and dry.  Neurological:     General: No focal deficit present.     Mental Status: She is alert and oriented to person, place, and time. Mental status is at baseline.  Psychiatric:        Mood and Affect: Mood normal.        Behavior: Behavior normal.      UC Treatments / Results  Labs (all labs ordered are listed, but only abnormal results are displayed) Labs Reviewed - No data to display  EKG   Radiology No results found.  Procedures Procedures (including critical care time)  Medications Ordered in UC Medications - No data to display  Initial Impression / Assessment and Plan / UC Course  I have reviewed the triage vital signs and the nursing notes.  Pertinent labs & imaging results that were available during my care of the patient were reviewed by me and considered in my medical decision making (see chart for details).     Given duration of symptoms and mild facial pain, I am concerned about sinus infection/secondary bacterial infection.  Therefore, will opt to treat with Augmentin antibiotic.  Advised supportive care and symptom management.  Advised strict return precautions.  Patient verbalized understanding and was agreeable with plan. Final Clinical Impressions(s) / UC Diagnoses   Final diagnoses:  Acute upper respiratory infection     Discharge Instructions      I have prescribed you an antibiotic for upper respiratory infection.  Please follow-up if any symptoms persist or worsen.    ED Prescriptions     Medication Sig Dispense Auth. Provider   amoxicillin-clavulanate (AUGMENTIN) 875-125 MG tablet Take 1 tablet by mouth every 12 (twelve) hours. 14 tablet [place], Acie Fredrickson,  [place]      PDMP not reviewed this encounter.   Gustavus Bryant, [place] 03/02/23 [phone]

## 2023-03-02 NOTE — ED Triage Notes (Signed)
One month ago had uri.  Patient took otc meds and it went away.    This past Monday, symptoms returned.  Symptoms are not responding to OTC medicines.  Has headache, sore throat were one week ago.  Headache in morning that eases throughout the day.  Now sinus congestion.  Denies ear pain or throat pain.  Reports 99-100 tempo on Saturday, none today

## 2023-03-02 NOTE — Discharge Instructions (Signed)
I have prescribed you an antibiotic for upper respiratory infection.  Please follow-up if any symptoms persist or worsen. 

## 2023-04-14 ENCOUNTER — Other Ambulatory Visit: Payer: Self-pay | Admitting: Obstetrics and Gynecology

## 2023-04-14 DIAGNOSIS — Z1231 Encounter for screening mammogram for malignant neoplasm of breast: Secondary | ICD-10-CM

## 2023-05-01 ENCOUNTER — Ambulatory Visit
Admission: RE | Admit: 2023-05-01 | Discharge: 2023-05-01 | Disposition: A | Discharge status: Home / Self Care | Payer: BC Managed Care – PPO | Source: Ambulatory Visit | Attending: Obstetrics and Gynecology | Admitting: Obstetrics and Gynecology

## 2023-05-01 DIAGNOSIS — Z1231 Encounter for screening mammogram for malignant neoplasm of breast: Secondary | ICD-10-CM

## 2023-05-20 ENCOUNTER — Telehealth: Payer: Self-pay | Admitting: Gastroenterology

## 2023-05-20 NOTE — Telephone Encounter (Addendum)
Hi Dr. [name],  Patient called regarding a referral we received for a repeat colonoscopy. There is a recall on file for 2027 the patient would like to know if that time frame is still correct since guidelines have changed. Please advise on scheduling.   Thank you

## 2023-05-20 NOTE — Telephone Encounter (Signed)
Age 47 is the new guideline for average risk CRC screening so they are due for colonoscopy now.

## 2023-05-28 NOTE — Telephone Encounter (Signed)
Called patient to advise and schedule left voicemail. 

## 2023-06-20 ENCOUNTER — Ambulatory Visit
Admission: EM | Admit: 2023-06-20 | Discharge: 2023-06-20 | Disposition: A | Discharge status: Home / Self Care | Payer: BC Managed Care – PPO | Attending: Physician Assistant | Admitting: Physician Assistant

## 2023-06-20 DIAGNOSIS — Z1152 Encounter for screening for COVID-19: Secondary | ICD-10-CM | POA: Diagnosis not present

## 2023-06-20 DIAGNOSIS — J069 Acute upper respiratory infection, unspecified: Secondary | ICD-10-CM | POA: Insufficient documentation

## 2023-06-20 LAB — POCT RAPID STREP A (OFFICE): Rapid Strep A Screen: NEGATIVE

## 2023-06-20 NOTE — ED Triage Notes (Signed)
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

## 2023-06-20 NOTE — ED Provider Notes (Signed)
[facility]    CSN: [id number] Arrival date & time: 06/20/23  1214      History   Chief Complaint Chief Complaint  Patient presents with   Sore Throat   COVID19 Testing    Request    HPI [name] is a 47 y.o. female.   Patient here today for evaluation of sore throat that started a few days ago.  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  She denies any fever.  She has not had any vomiting or diarrhea.  She does not report cough.  The history is provided by the patient.  Sore Throat Pertinent negatives include no abdominal pain and no shortness of breath.    History reviewed. No pertinent past medical history.  There are no problems to display for this patient.   Past Surgical History:  Procedure Laterality Date   CESAREAN SECTION      OB History   No obstetric history on file.      Home Medications    Prior to Admission medications   Medication Sig Start Date End Date Taking? Authorizing Provider  escitalopram (LEXAPRO) 5 MG tablet Take 5 mg by mouth daily.   Yes [provider]  Multiple Vitamin (DAILY VITAMIN PO) Take by mouth.   Yes [provider]  amoxicillin-clavulanate (AUGMENTIN) 875-125 MG tablet Take 1 tablet by mouth every 12 (twelve) hours. 03/02/23   Gustavus Bryant, FNP  Docusate Sodium (COLACE PO) Take by mouth. As needed     [provider]  Multiple Vitamins-Calcium (ONE-A-DAY WOMENS FORMULA PO) Take by mouth 1 dose over 46 hours.      [provider]  Norgestim-Eth Estrad Triphasic (ORTHO TRI-CYCLEN, 28, PO) Take by mouth 1 dose over 46 hours.   Patient not taking: Reported on 03/02/2023    [provider]    Family History Family History  Problem Relation Age of Onset   Lymphoma Father    Ulcerative colitis Sister    Colon cancer Maternal Grandfather    Colon polyps Maternal Grandfather        MOTHER   Breast cancer Cousin      Social History Social History   Tobacco Use   Smoking status: Never   Smokeless tobacco: Never  Vaping Use   Vaping status: Never Used  Substance Use Topics   Alcohol use: No   Drug use: No     Allergies   Patient has no known allergies.   Review of Systems Review of Systems  Constitutional:  Negative for chills and fever.  HENT:  Positive for congestion and sore throat. Negative for ear pain.   Eyes:  Negative for discharge and redness.  Respiratory:  Negative for cough, shortness of breath and wheezing.   Gastrointestinal:  Negative for abdominal pain, diarrhea, nausea and vomiting.     Physical Exam Triage Vital Signs ED Triage Vitals  Encounter Vitals Group     BP 06/20/23 1316 103/71     Systolic BP Percentile --      Diastolic BP Percentile --      Pulse Rate 06/20/23 1316 (!) 102     Resp 06/20/23 1316 18     Temp 06/20/23 1316 99.2 F (37.3 C)     Temp Source 06/20/23 1316 Oral     SpO2 06/20/23 1316 99 %     Weight 06/20/23 1314 160 lb (72.6 kg)  Height 06/20/23 1314 5\' 5"  (1.651 m)     Head Circumference --      Peak Flow --      Pain Score 06/20/23 1312 4     Pain Loc --      Pain Education --      Exclude from Growth Chart --    No data found.  Updated Vital Signs BP 103/71 (BP Location: Left Arm)   Pulse (!) 102   Temp 99.2 F (37.3 C) (Oral)   Resp 18   Ht 5\' 5"  (1.651 m)   Wt 160 lb (72.6 kg)   LMP 06/16/2023 (Exact Date)   SpO2 99%   BMI 26.63 kg/m   Physical Exam Vitals and nursing note reviewed.  Constitutional:      General: She is not in acute distress.    Appearance: Normal appearance. She is not ill-appearing.  HENT:     Head: Normocephalic and atraumatic.     Nose: Congestion present.     Mouth/Throat:     Mouth: Mucous membranes are moist.     Pharynx: Posterior oropharyngeal erythema present. No oropharyngeal exudate.  Eyes:     Conjunctiva/sclera: Conjunctivae normal.  Cardiovascular:     Rate and  Rhythm: Normal rate and regular rhythm.     Heart sounds: Normal heart sounds. No murmur heard. Pulmonary:     Effort: Pulmonary effort is normal. No respiratory distress.     Breath sounds: Normal breath sounds. No wheezing, rhonchi or rales.  Skin:    General: Skin is warm and dry.  Neurological:     Mental Status: She is alert.  Psychiatric:        Mood and Affect: Mood normal.        Thought Content: Thought content normal.      UC Treatments / Results  Labs (all labs ordered are listed, but only abnormal results are displayed) Labs Reviewed  SARS CORONAVIRUS 2 (TAT 6-24 HRS)  POCT RAPID STREP A (OFFICE)    EKG   Radiology No results found.  Procedures Procedures (including critical care time)  Medications Ordered in UC Medications - No data to display  Initial Impression / Assessment and Plan / UC Course  I have reviewed the triage vital signs and the nursing notes.  Pertinent labs & imaging results that were available during my care of the patient were reviewed by me and considered in my medical decision making (see chart for details).    Suspect likely viral etiology of symptoms.  Strep screening negative will order COVID screening.  Recommended follow-up with any further concerns.  Advised symptomatic treatment, increase fluids and rest.  Final Clinical Impressions(s) / UC Diagnoses   Final diagnoses:  Acute upper respiratory infection   Discharge Instructions   None    ED Prescriptions   None    PDMP not reviewed this encounter.   Tomi Bamberger, PA-C 06/22/23 1905

## 2023-06-21 LAB — SARS CORONAVIRUS 2 (TAT 6-24 HRS): SARS Coronavirus 2: NEGATIVE

## 2023-06-22 ENCOUNTER — Encounter: Payer: Self-pay | Admitting: Physician Assistant

## 2024-04-07 ENCOUNTER — Other Ambulatory Visit: Payer: Self-pay | Admitting: Obstetrics and Gynecology

## 2024-04-07 DIAGNOSIS — Z1231 Encounter for screening mammogram for malignant neoplasm of breast: Secondary | ICD-10-CM

## 2024-05-03 ENCOUNTER — Ambulatory Visit: Payer: Self-pay

## 2024-05-05 ENCOUNTER — Ambulatory Visit
Admission: RE | Admit: 2024-05-05 | Discharge: 2024-05-05 | Disposition: A | Discharge status: Home / Self Care | Payer: Self-pay | Source: Ambulatory Visit | Attending: Obstetrics and Gynecology | Admitting: Obstetrics and Gynecology

## 2024-05-05 DIAGNOSIS — Z1231 Encounter for screening mammogram for malignant neoplasm of breast: Secondary | ICD-10-CM

## 2024-05-12 ENCOUNTER — Encounter: Payer: Self-pay | Admitting: Gastroenterology

## 2024-07-07 ENCOUNTER — Ambulatory Visit: Admitting: Gastroenterology

## 2024-07-07 ENCOUNTER — Encounter: Payer: Self-pay | Admitting: Gastroenterology

## 2024-07-07 VITALS — BP 110/70 | HR 76 | Ht 64.0 in | Wt 161.5 lb

## 2024-07-07 DIAGNOSIS — Z800 Family history of malignant neoplasm of digestive organs: Secondary | ICD-10-CM | POA: Diagnosis not present

## 2024-07-07 DIAGNOSIS — Z1211 Encounter for screening for malignant neoplasm of colon: Secondary | ICD-10-CM

## 2024-07-07 NOTE — Progress Notes (Signed)
 Chief Complaint:Encounter for screening colonoscopy  Primary GI Doctor: (previously Dr. Aneita) Dr. Federico   HPI:  Patient is a  48  year old female patient with past medical history of anxiety, who was referred to me by Prevost, Mary Ellen, FNP on 05/06/24 for a evaluation of encounter for screening colonoscopy.    Interval History    Patient presents for evaluation for colon screening colonoscopy. Her last colonoscopy wa sin 2012 with Dr. Aneita and normal. Patient denies abdominal pain or rectal bleeding. 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     Patient denies GERD or dysphagia. Patient denies nausea, vomiting, or weight loss.  Denies alcohol use. Nonsmoker.  Surgical history: C section  Patient is a Runner, broadcasting/film/video.  Patient's family history includes:Colon cancer in her maternal grandfather; Colon polyps in her maternal grandfather; and Ulcerative colitis in her sister , mother with IBS and GERD  Wt Readings from Last 3 Encounters:  07/07/24 161 lb 8 oz (73.3 kg)  06/20/23 160 lb (72.6 kg)  03/24/15 158 lb (71.7 kg)    Past Medical History:  Diagnosis Date   Anxiety     Past Surgical History:  Procedure Laterality Date   CESAREAN SECTION      Current Outpatient Medications  Medication Sig Dispense Refill   Cholecalciferol (VITAMIN D3) 50 MCG (2000 UT) TABS Take 1 tablet by mouth daily.     escitalopram (LEXAPRO) 5 MG tablet Take 5 mg by mouth daily.     Multiple Vitamins-Calcium (ONE-A-DAY WOMENS FORMULA PO) Take by mouth 1 dose over 46 hours.       Current Facility-Administered Medications  Medication Dose Route Frequency Provider Last Rate Last Admin   0.9 %  sodium chloride  infusion  500 mL Intravenous Continuous Aneita Gwendlyn DASEN, MD        Allergies as of 07/07/2024   (No Known Allergies)    Family History  Problem Relation Age of Onset   Irritable bowel syndrome Mother    Osteoarthritis Mother     Hypertension Mother    Sleep apnea Mother    Thyroid  disease Mother    Lymphoma Father    Ulcerative colitis Sister    Alzheimer's disease Maternal Grandmother    Colon cancer Maternal Grandfather    Colon polyps Maternal Grandfather        MOTHER   Diabetes Paternal Grandmother    Multiple sclerosis Paternal Grandmother    Heart attack Paternal Grandfather    Breast cancer Cousin 38    Review of Systems:    Constitutional: No weight loss, fever, chills, weakness or fatigue HEENT: Eyes: No change in vision               Ears, Nose, Throat:  No change in hearing or congestion Skin: No rash or itching Cardiovascular: No chest pain, chest pressure or palpitations   Respiratory: No SOB or cough Gastrointestinal: See HPI and otherwise negative Genitourinary: No dysuria or change in urinary frequency Neurological: No headache, dizziness or syncope Musculoskeletal: No new muscle or joint pain Hematologic: No bleeding or bruising Psychiatric: No history of depression or anxiety    Physical Exam:  Vital signs: BP 110/70 (BP Location: Left Arm, Patient Position: Sitting, Cuff Size: Normal)   Pulse 76   Ht 5' 4 (1.626 m) Comment: height measured without shoes  Wt 161 lb 8 oz (73.3 kg)   LMP 06/26/2024   BMI 27.72 kg/m  Constitutional:   Pleasant female appears to be in NAD, Well developed, Well nourished, alert and cooperative Throat: Oral cavity and pharynx without inflammation, swelling or lesion.  Respiratory: Respirations even and unlabored. Lungs clear to auscultation bilaterally.   No wheezes, crackles, or rhonchi.  Cardiovascular: Normal S1, S2. Regular rate and rhythm. No peripheral edema, cyanosis or pallor.  Gastrointestinal:  Soft, nondistended, nontender. No rebound or guarding. Normal bowel sounds. No appreciable masses or hepatomegaly. Rectal:  Not performed.  Msk:  Symmetrical without gross deformities. Without edema, no deformity or joint abnormality.   Neurologic:  Alert and  oriented x4;  grossly normal neurologically.  Skin:   Dry and intact without significant lesions or rashes.  RELEVANT LABS AND IMAGING: CBC    Latest Ref Rng & Units 11/14/2010    5:50 AM 11/12/2010    8:31 PM  CBC  WBC 4.0 - 10.5 K/uL 11.5  8.3   Hemoglobin 12.0 - 15.0 g/dL 89.1 DELTA CHECK NOTED REPEATED TO VERIFY  13.4   Hematocrit 36.0 - 46.0 % 32.1  39.4   Platelets 150 - 400 K/uL 127  143   04/28/24 labs show: wbc 4.8, hgb 14.2, plt 193,BUN 13,Creat 0.82, normal LFTs  GI procedures: 01/2011 colonoscopy with Dr. Aneita Normal colon and TI  Assessment: Encounter Diagnosis  Name Primary?   Special screening for malignant neoplasms, colon Yes     48 year old female patient who presents for colon screening colonoscopy for surveillance. No current GI issues. 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 Will put in recall.  Plan: -Schedule for a colonoscopy in [facility] with Dr. Federico for Jan 2026. The risks and benefits of colonoscopy with possible polypectomy / biopsies were discussed and the patient agrees to proceed.   Thank you for the courtesy of this consult. Please call me with any questions or concerns.   Aerith Canal, FNP-C [facility] 07/07/2024, 8:47 AM  Cc: Royden Ronal Czar, FNP

## 2024-07-07 NOTE — Patient Instructions (Addendum)
 Recommend high fiber diet Recommend starting Citrucel 1 tsp po daily   _______________________________________________________  If your blood pressure at your visit was 140/90 or greater, please contact your primary care physician to follow up on this.  _______________________________________________________  If you are age 48 or older, your body mass index should be between 23-30. Your Body mass index is 27.72 kg/m. If this is out of the aforementioned range listed, please consider follow up with your Primary Care Provider.  If you are age 70 or younger, your body mass index should be between 19-25. Your Body mass index is 27.72 kg/m. If this is out of the aformentioned range listed, please consider follow up with your Primary Care Provider.   ________________________________________________________  The [facility] providers would like to encourage you to use MYCHART to communicate with providers for non-urgent requests or questions.  Due to long hold times on the telephone, sending your provider a message by [facility] may be a faster and more efficient way to get a response.  Please allow 48 business hours for a response.  Please remember that this is for non-urgent requests.  _______________________________________________________  [facility] is using a team-based approach to care.  Your team is made up of your doctor and two to three APPS. Our APPS (Nurse Practitioners and Physician Assistants) work with your physician to ensure care continuity for you. They are fully qualified to address your health concerns and develop a treatment plan. They communicate directly with your gastroenterologist to care for you. Seeing the Advanced Practice Practitioners on your physician's team can help you by facilitating care more promptly, often allowing for earlier appointments, access to diagnostic testing, procedures, and other specialty referrals.   Thank you for trusting me with your  gastrointestinal care. Deanna May, FNP-C

## 2024-11-10 ENCOUNTER — Encounter: Payer: Self-pay | Admitting: Internal Medicine
